# Patient Record
Sex: Male | Born: 1960 | Race: White | Hispanic: No | Marital: Married | Smoking: Former smoker
Health system: Southern US, Community
[De-identification: ages and names within clinical notes are randomized; demographics above are authoritative.]

## PROBLEM LIST (undated history)

## (undated) HISTORY — PX: APPENDECTOMY: SHX54

---

## 1999-06-14 ENCOUNTER — Other Ambulatory Visit: Admission: RE | Admit: 1999-06-14 | Discharge: 1999-06-14 | Payer: Self-pay | Admitting: Otolaryngology

## 2001-07-30 ENCOUNTER — Encounter: Payer: Self-pay | Admitting: Gastroenterology

## 2001-07-30 ENCOUNTER — Encounter: Admission: RE | Admit: 2001-07-30 | Discharge: 2001-07-30 | Payer: Self-pay | Admitting: Gastroenterology

## 2001-09-13 ENCOUNTER — Ambulatory Visit (HOSPITAL_COMMUNITY): Admission: RE | Admit: 2001-09-13 | Discharge: 2001-09-13 | Payer: Self-pay | Admitting: Gastroenterology

## 2001-09-13 ENCOUNTER — Encounter (INDEPENDENT_AMBULATORY_CARE_PROVIDER_SITE_OTHER): Payer: Self-pay | Admitting: Specialist

## 2008-11-25 ENCOUNTER — Encounter: Admission: RE | Admit: 2008-11-25 | Discharge: 2008-11-25 | Payer: Self-pay | Admitting: Gastroenterology

## 2010-03-13 IMAGING — CT CT ABDOMEN W/ CM
2 of 5 series · 17 of 46 positions shown, 19 images · IV contrast (CONTRAST)
Comparison: None

CT ABDOMEN

CLINICAL DATA: left lower quadrant pain

CT ABDOMEN AND PELVIS WITH CONTRAST
TECHNIQUE: Multidetector CT imaging of the abdomen and pelvis was
performed using the standard protocol following bolus
administration of intravenous contrast.
Contrast: 125 ml of Optiray 350

[Series 2: portal · axial · portal-venous · 0.91mm/px · z∈[+1164,+1604]mm · 14 of 102 slices shown, 16 images]
[im 7/102  soft-tissue]
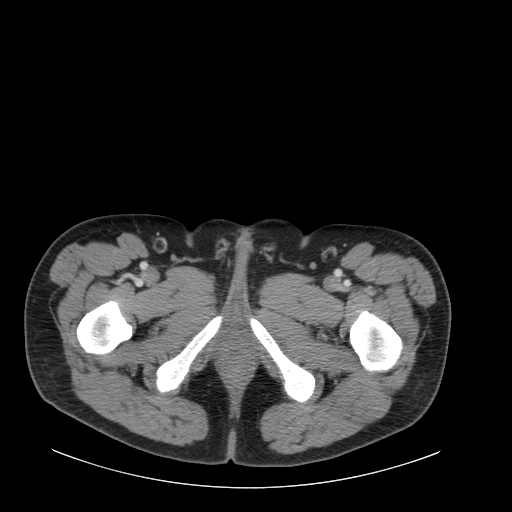
[im 7/102  bone]
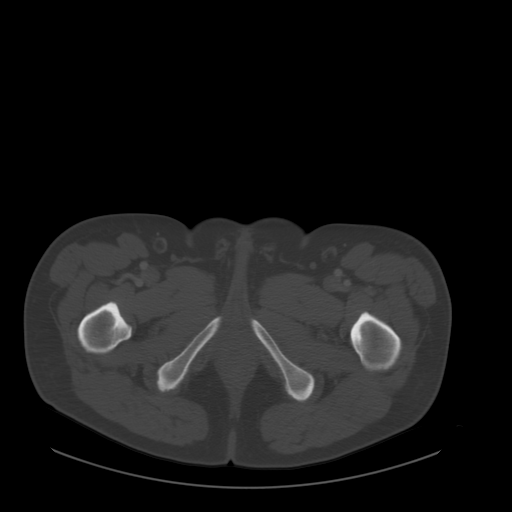
[im 13/102  soft-tissue]
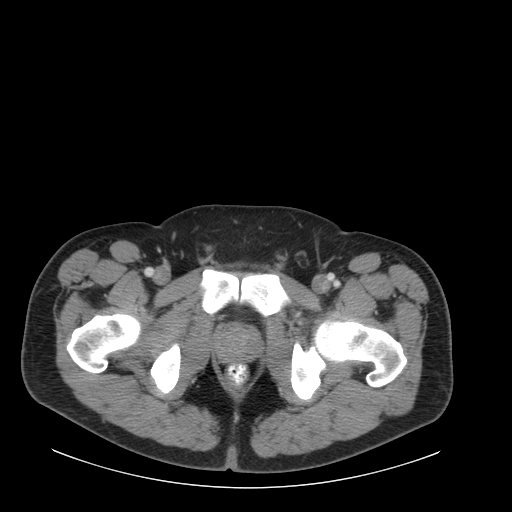
[im 19/102  soft-tissue]
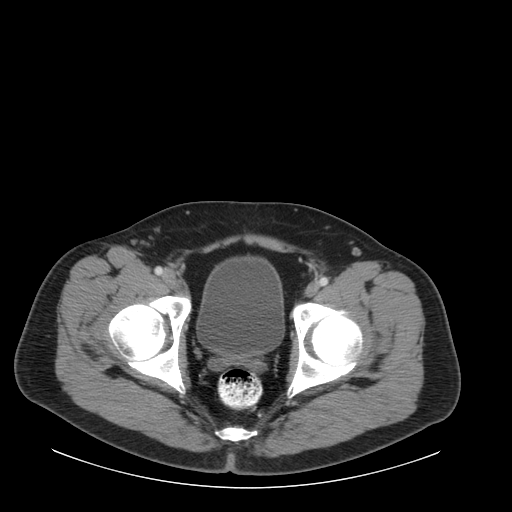
[im 26/102  soft-tissue]
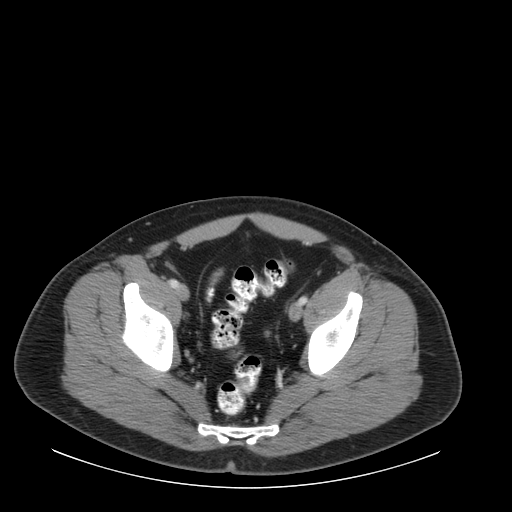
[im 32/102  soft-tissue]
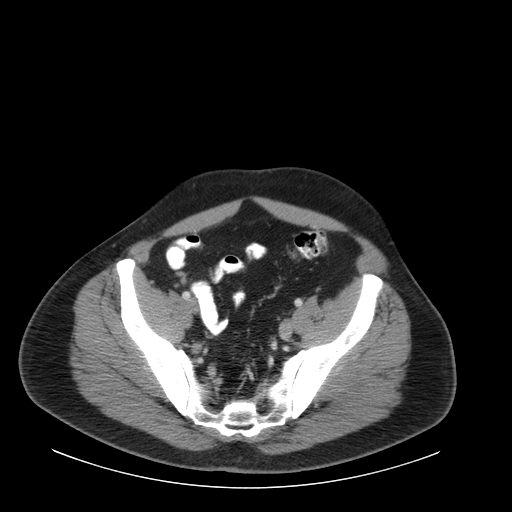
[im 38/102  soft-tissue]
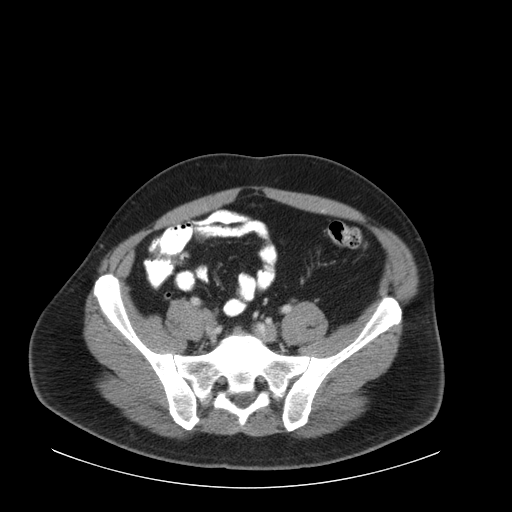
[im 45/102  soft-tissue]
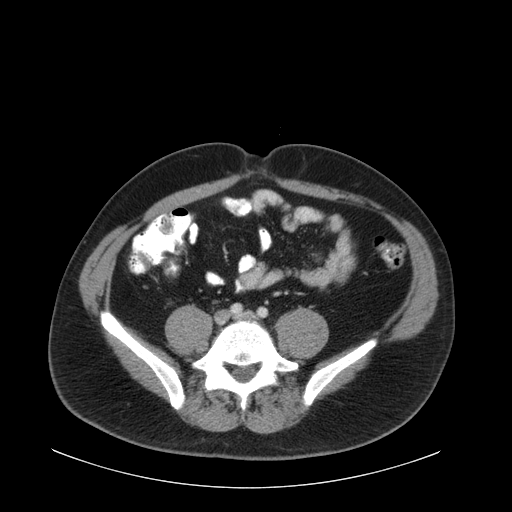
[im 57/102  soft-tissue]
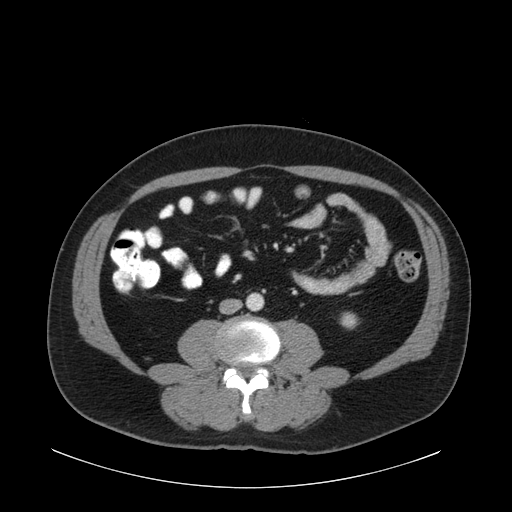
[im 64/102  soft-tissue]
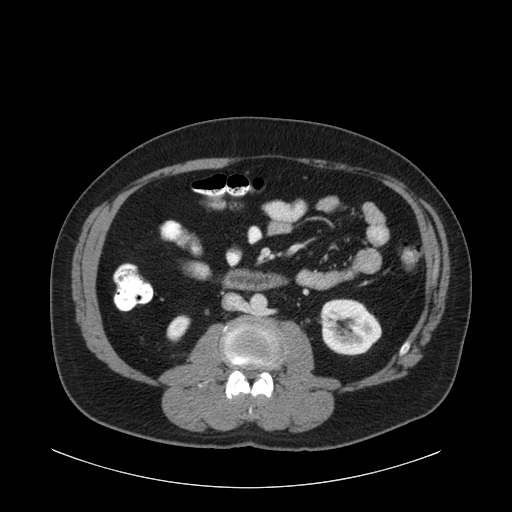
[im 64/102  bone]
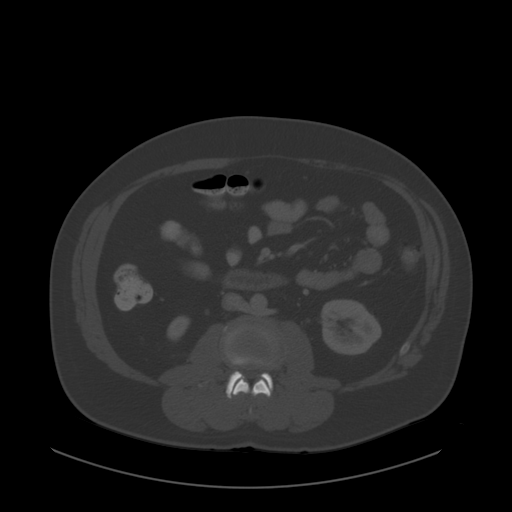
[im 70/102  soft-tissue]
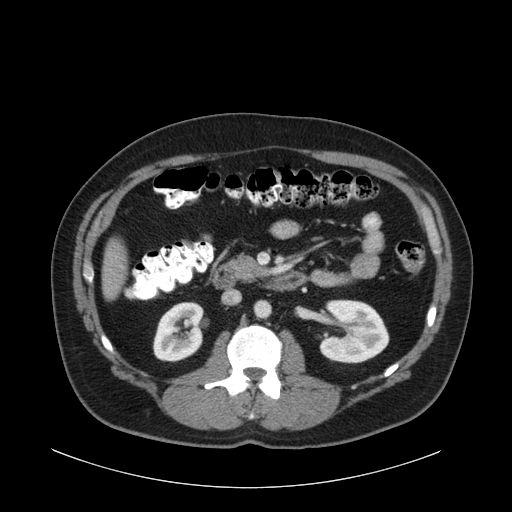
[im 76/102  soft-tissue]
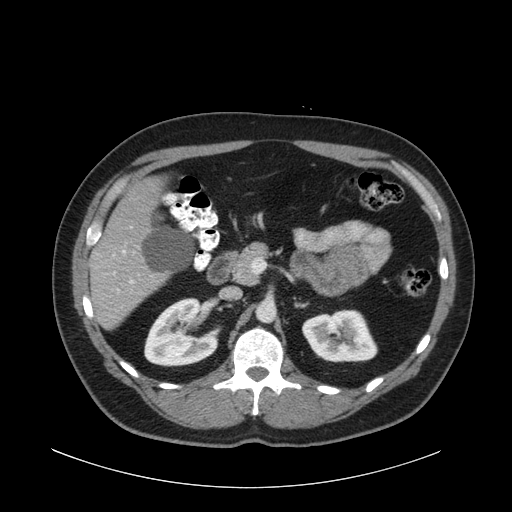
[im 83/102  soft-tissue]
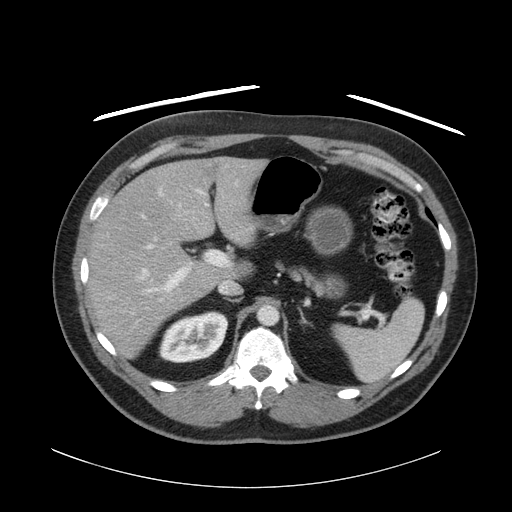
[im 89/102  soft-tissue]
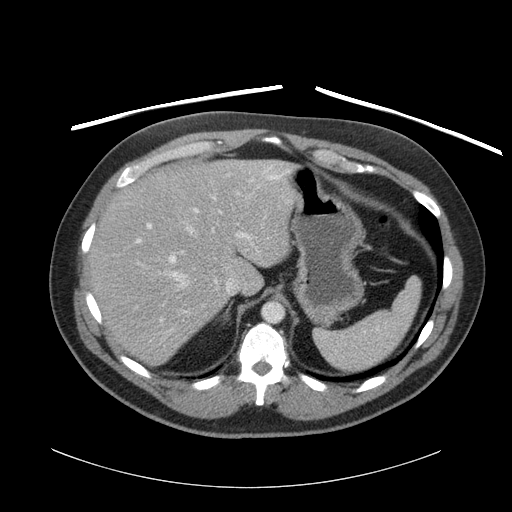
[im 95/102  soft-tissue]
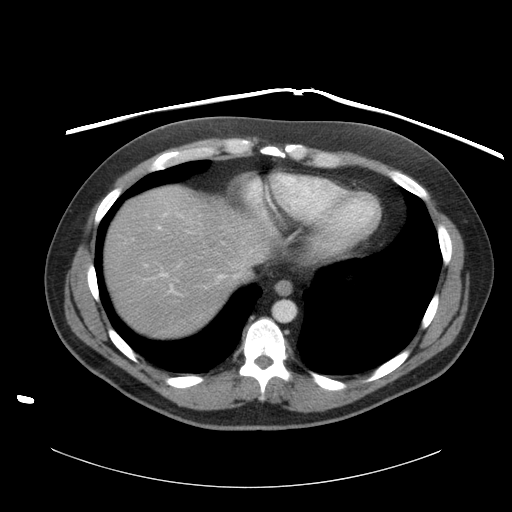

[coronals · coronal · 0.98mm/px · 3 of 125 slices shown]
[im 42/125  soft-tissue]
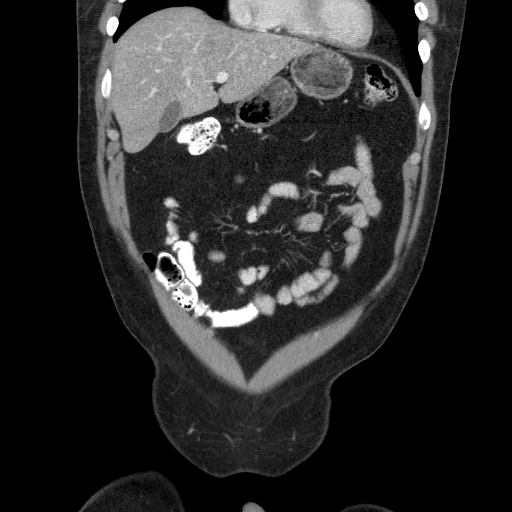
[im 56/125  soft-tissue]
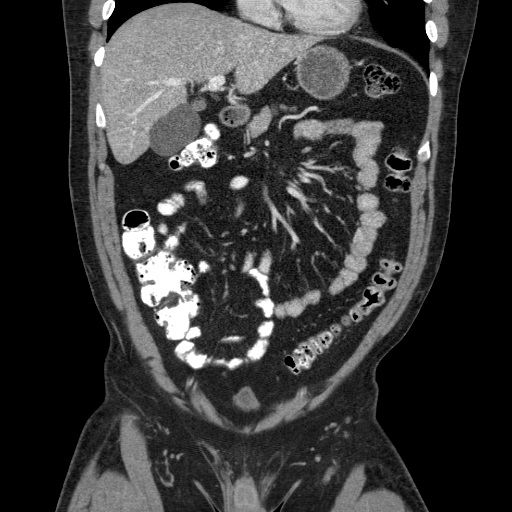
[im 69/125  soft-tissue]
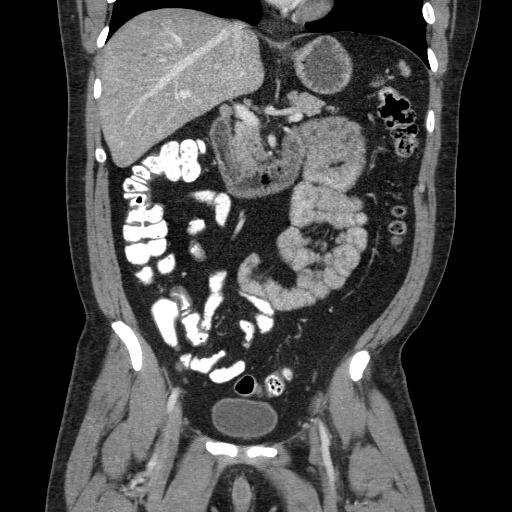

[17 of 46 positions shown; findings below may reference images not displayed]

FINDINGS: There is a calcified granuloma within the right lung
base.

There is mild fatty infiltration of the liver.

There is an exophytic low density structure arising from the
inferior right hepatic lobe measuring 4.6 cm.  This measures water
density with Hounsfield units equal to 4.2.

No internal areas of septation are associated with this structure.

Spleen is normal.

The pancreas is normal.

The adrenal glands are normal.

 The kidneys are normal.

Gallbladder is unremarkable by CT.

No biliary ductal dilation.

Stomach and visualized large and small bowel are unremarkable.

Abdominal aorta normal is in caliber.

No significant lymphadenopathy.

No free fluid or abnormal fluid collections.
IMPRESSION: 1.  No acute upper abdominal CT findings.
2.  Right hepatic lobe cyst
3.  Fatty infiltration of the liver.

CT PELVIS
FINDINGS: Appendix identified and normal.

Visualized colon and small bowel are unremarkable.

No free fluid or abnormal fluid collections.

No significant lymphadenopathy.

Urinary bladder is normal.
IMPRESSION: 1.  No acute pelvic CT findings.

## 2011-02-17 NOTE — Procedures (Signed)
[facility]  Patient:    Frederick Bell, BOESEN Visit Number: [id number] MRN: [id number]          Service Type: END Location: ENDO Attending Physician:  Charna Elizabeth Dictated by:   Anselmo Rod, M.D. Proc. Date: 09/13/01 Admit Date:  09/13/2001   CC:         Reuben Likes, M.D.   Procedure Report  DATE OF BIRTH:  August 23, 1961.  PROCEDURE:  Colonoscopy with snare polypectomy x 1.  ENDOSCOPIST:  Anselmo Rod, M.D.  INSTRUMENT USED:  Olympus video colonoscope.  INDICATION FOR PROCEDURE:  Blood in stool and left lower quadrant pain in a 50 year old white male.  Rule out colonic polyps, masses, hemorrhoids, etc.  PREPROCEDURE PREPARATION:  Informed consent was procured from the patient. The patient was fasted for eight hours prior to the procedure and prepped with a bottle of magnesium citrate and a gallon of NuLytely the night prior to the procedure.  PREPROCEDURE PHYSICAL:  VITAL SIGNS:  The patient had stable vital signs.  NECK:  Supple.  CHEST:  Clear to auscultation.  S1, S2 regular.  ABDOMEN:  Soft with normal bowel sounds.  DESCRIPTION OF PROCEDURE:  The patient was placed in the left lateral decubitus position and sedated with 35 mg of Demerol and 5 mg of Versed intravenously.  Once the patient was adequately sedate and maintained on low-flow oxygen and continuous cardiac monitoring, the Olympus video colonoscope was advanced from the rectum to the cecum without difficulty.  The patient had small amount of residual stool in the colon.  Multiple washes were done.  A small sessile polyp was snared from the midtransverse colon.  The procedure was complete up to the cecum.  The appendiceal orifice and ileocecal valve were clearly visualized and photographed.  IMPRESSION:  Small sessile polyp snared from midtransverse colon, otherwise normal colonoscopy.  RECOMMENDATIONS: 1. Await pathology results. 2. Outpatient follow-up  in the next three to four weeks. Dictated by:   Anselmo Rod, M.D. Attending Physician:  Charna Elizabeth DD:  09/13/01 TD:  09/13/01 Job: [id number]

## 2015-03-16 ENCOUNTER — Ambulatory Visit (INDEPENDENT_AMBULATORY_CARE_PROVIDER_SITE_OTHER): Payer: 59 | Admitting: Sports Medicine

## 2015-03-16 VITALS — BP 120/78 | HR 86 | Temp 98.8°F | Resp 20 | Ht 72.0 in | Wt 238.0 lb

## 2015-03-16 DIAGNOSIS — Z7189 Other specified counseling: Secondary | ICD-10-CM | POA: Diagnosis not present

## 2015-03-16 DIAGNOSIS — IMO0002 Reserved for concepts with insufficient information to code with codable children: Secondary | ICD-10-CM

## 2015-03-16 NOTE — Progress Notes (Signed)

## 2015-03-16 NOTE — Progress Notes (Signed)
   Subjective:    Patient ID: Frederick Bell, male    DOB: 01-21-1961, 54 y.o.   MRN: [id number]  HPI Presenting for pretravel health evaluation. Report currently up-to-date on all immunizations recently seen in health department for hepatitis A and B per CDC recommendations. No prior TB exposure. TB questionnaire performed by myself per her documentation with no evidence of latent or active TB. Report prior PPD reported as negative.  Review of Systems per HPI     Objective:   Physical Exam PHYSICAL EXAM: GENERAL: Adult male. No acute distress PSYCH: Alert and appropriately interactive. SKIN: No open skin lesions or abnormal skin markings on areas inspected as below VASCULAR: DP & PT pulses 2+/4. No pre-tibial edema HEART: S1-S2 heard, regular rate and rhythm, no murmur. LUNGS: Clear to auscultation bilaterally throughout all lung fields     Assessment & Plan:  Pretravel health screening forms for traveling to [place] completed today. Is been provided on infectious disease good practices including hand hygiene and respiratory practices. Follow-up as needed

## 2015-11-30 ENCOUNTER — Ambulatory Visit (INDEPENDENT_AMBULATORY_CARE_PROVIDER_SITE_OTHER): Payer: 59 | Admitting: Urgent Care

## 2015-11-30 VITALS — BP 140/80 | HR 89 | Temp 98.9°F | Resp 16 | Ht 72.0 in | Wt 240.6 lb

## 2015-11-30 DIAGNOSIS — R0982 Postnasal drip: Secondary | ICD-10-CM

## 2015-11-30 DIAGNOSIS — R05 Cough: Secondary | ICD-10-CM | POA: Diagnosis not present

## 2015-11-30 DIAGNOSIS — Z23 Encounter for immunization: Secondary | ICD-10-CM | POA: Diagnosis not present

## 2015-11-30 DIAGNOSIS — Z119 Encounter for screening for infectious and parasitic diseases, unspecified: Secondary | ICD-10-CM

## 2015-11-30 DIAGNOSIS — R059 Cough, unspecified: Secondary | ICD-10-CM

## 2015-11-30 DIAGNOSIS — J329 Chronic sinusitis, unspecified: Secondary | ICD-10-CM

## 2015-11-30 DIAGNOSIS — J302 Other seasonal allergic rhinitis: Secondary | ICD-10-CM

## 2015-11-30 DIAGNOSIS — R030 Elevated blood-pressure reading, without diagnosis of hypertension: Secondary | ICD-10-CM | POA: Diagnosis not present

## 2015-11-30 MED ORDER — PSEUDOEPHEDRINE HCL ER 120 MG PO TB12: 120.0000 mg | ORAL_TABLET | Freq: Two times a day (BID) | ORAL | Status: AC

## 2015-11-30 MED ORDER — CETIRIZINE HCL 10 MG PO TABS: 10.0000 mg | ORAL_TABLET | Freq: Every day | ORAL | Status: AC

## 2015-11-30 NOTE — Progress Notes (Signed)
    MRN: [id number] DOB: 05/23/1961  Subjective:   Frederick Bell is a 55 y.o. 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 Admits 1 week history of cough. Has tried Mucinex with some relief. Admits history of seasonal allergies but does not take any medications for this. Denies fever, chest pain, shob, n/v, abdominal pain, weight loss. Denies smoking cigarettes, has occasional drink of alcohol. Denies history of HIV, syphilis, tuberculosis. Denies history of mental illness, thoughts about hurting himself or others.   Frederick Bell has a current medication list which includes the following prescription(s): guaifenesin. Also has no allergies on file.  Frederick Bell  has no past medical history on file. Also  has past surgical history that includes Appendectomy.  Objective:   Vitals: BP 140/80 mmHg  Pulse 89  Temp(Src) 98.9 F (37.2 C) (Oral)  Resp 16  Ht 6' (1.829 m)  Wt 240 lb 9.6 oz (109.135 kg)  BMI 32.62 kg/m2  SpO2 98%  Wt Readings from Last 3 Encounters:  11/30/15 240 lb 9.6 oz (109.135 kg)  03/16/15 238 lb (107.956 kg)   Physical Exam  Constitutional: He is oriented to person, place, and time. He appears well-developed and well-nourished.  HENT:  TM's intact bilaterally, no effusions or erythema. Nasal turbinates boggy and edematous. No sinus tenderness. Postnasal drip present, without oropharyngeal exudates, erythema or abscesses.  Eyes: Right eye exhibits no discharge. Left eye exhibits no discharge. No scleral icterus.  Neck: Normal range of motion. Neck supple.  Cardiovascular: Normal rate, regular rhythm and intact distal pulses.  Exam reveals no gallop and no friction rub.   No murmur heard. Pulmonary/Chest: No respiratory distress. He has no wheezes. He has no rales.  Abdominal: Soft. Bowel sounds are normal. He exhibits no distension and no mass. There is no tenderness.    Musculoskeletal: He exhibits no edema.  Lymphadenopathy:    He has no cervical adenopathy.  Neurological: He is alert and oriented to person, place, and time.  Skin: Skin is warm and dry.   Assessment and Plan :   1. Screening examination for infectious disease - Labs pending, will complete form thereafter.  2. Need for prophylactic vaccination and inoculation against influenza - I suggested patient hold off on this until his current symptoms resolve.   3. Cough 4. Post-nasal drainage 5. Seasonal allergies - Advised better control of allergies with Zyrtec and Sudafed as needed. RTC in 1 week if no improvement.   6. Elevated blood pressure reading without diagnosis of hypertension - Monitor, counseled patient on risks of uncontrolled hypertension. He agreed to keep a check on his BP and rtc if it remains >140/90.  Wallis Bamberg, PA-C [facility] [phone] 11/30/2015 2:03 PM

## 2015-11-30 NOTE — Patient Instructions (Signed)

## 2015-12-01 LAB — HIV ANTIBODY (ROUTINE TESTING W REFLEX): HIV 1&2 Ab, 4th Generation: NONREACTIVE

## 2015-12-01 LAB — RPR

## 2015-12-02 LAB — QUANTIFERON TB GOLD ASSAY (BLOOD)
Interferon Gamma Release Assay: NEGATIVE
Mitogen-Nil: >10 IU/mL
Quantiferon Nil Value: 0.02 IU/mL
Quantiferon Tb Ag Minus Nil Value: 0 IU/mL

## 2015-12-03 ENCOUNTER — Telehealth: Payer: Self-pay | Admitting: Urgent Care

## 2015-12-03 NOTE — Telephone Encounter (Signed)
Pt has called about his lab results.  He stated that he is going in for a visa on Monday and will need the results of these tests by Monday.  Kathlene NovemberMike please advise. Thanks

## 2015-12-03 NOTE — Telephone Encounter (Signed)
Paper work was complete and patient notified.

## 2018-07-24 ENCOUNTER — Other Ambulatory Visit: Payer: Self-pay | Admitting: Gastroenterology

## 2018-07-24 DIAGNOSIS — R7989 Other specified abnormal findings of blood chemistry: Secondary | ICD-10-CM

## 2018-07-24 DIAGNOSIS — R945 Abnormal results of liver function studies: Principal | ICD-10-CM

## 2018-07-29 ENCOUNTER — Ambulatory Visit (HOSPITAL_COMMUNITY)
Admission: RE | Admit: 2018-07-29 | Discharge: 2018-07-29 | Disposition: A | Discharge status: Home / Self Care | Payer: Managed Care, Other (non HMO) | Source: Ambulatory Visit | Attending: Gastroenterology | Admitting: Gastroenterology

## 2018-07-29 DIAGNOSIS — K7689 Other specified diseases of liver: Secondary | ICD-10-CM | POA: Insufficient documentation

## 2018-07-29 DIAGNOSIS — R7989 Other specified abnormal findings of blood chemistry: Secondary | ICD-10-CM | POA: Diagnosis not present

## 2018-07-31 ENCOUNTER — Other Ambulatory Visit: Payer: Self-pay | Admitting: Gastroenterology

## 2018-07-31 DIAGNOSIS — R933 Abnormal findings on diagnostic imaging of other parts of digestive tract: Secondary | ICD-10-CM

## 2018-08-02 ENCOUNTER — Telehealth: Payer: Self-pay | Admitting: Gastroenterology

## 2018-08-05 ENCOUNTER — Ambulatory Visit (HOSPITAL_COMMUNITY): Admission: RE | Admit: 2018-08-05 | Payer: Managed Care, Other (non HMO) | Source: Ambulatory Visit

## 2018-08-05 ENCOUNTER — Other Ambulatory Visit: Payer: Self-pay | Admitting: Gastroenterology

## 2018-08-05 DIAGNOSIS — R933 Abnormal findings on diagnostic imaging of other parts of digestive tract: Secondary | ICD-10-CM

## 2018-08-07 ENCOUNTER — Ambulatory Visit
Admission: RE | Admit: 2018-08-07 | Discharge: 2018-08-07 | Disposition: A | Discharge status: Home / Self Care | Payer: Managed Care, Other (non HMO) | Source: Ambulatory Visit | Attending: Gastroenterology | Admitting: Gastroenterology

## 2018-08-07 DIAGNOSIS — R933 Abnormal findings on diagnostic imaging of other parts of digestive tract: Secondary | ICD-10-CM

## 2018-08-07 MED ORDER — GADOBENATE DIMEGLUMINE 529 MG/ML IV SOLN
20.0000 mL | Freq: Once | INTRAVENOUS | Status: AC | PRN
  Administered 2018-08-07: 20 mL via INTRAVENOUS

## 2018-08-13 ENCOUNTER — Other Ambulatory Visit: Payer: Managed Care, Other (non HMO)

## 2018-12-10 ENCOUNTER — Other Ambulatory Visit (HOSPITAL_BASED_OUTPATIENT_CLINIC_OR_DEPARTMENT_OTHER): Payer: Self-pay

## 2018-12-10 DIAGNOSIS — R0683 Snoring: Secondary | ICD-10-CM

## 2018-12-10 DIAGNOSIS — G4710 Hypersomnia, unspecified: Secondary | ICD-10-CM

## 2018-12-10 DIAGNOSIS — R5383 Other fatigue: Secondary | ICD-10-CM

## 2018-12-10 DIAGNOSIS — G4730 Sleep apnea, unspecified: Secondary | ICD-10-CM

## 2018-12-17 ENCOUNTER — Encounter (HOSPITAL_BASED_OUTPATIENT_CLINIC_OR_DEPARTMENT_OTHER): Payer: Managed Care, Other (non HMO)

## 2019-01-15 ENCOUNTER — Encounter (HOSPITAL_BASED_OUTPATIENT_CLINIC_OR_DEPARTMENT_OTHER): Payer: Managed Care, Other (non HMO)

## 2019-02-11 ENCOUNTER — Encounter (HOSPITAL_BASED_OUTPATIENT_CLINIC_OR_DEPARTMENT_OTHER): Payer: Managed Care, Other (non HMO)

## 2019-02-21 ENCOUNTER — Ambulatory Visit (HOSPITAL_BASED_OUTPATIENT_CLINIC_OR_DEPARTMENT_OTHER): Payer: Managed Care, Other (non HMO) | Attending: Internal Medicine | Admitting: Internal Medicine

## 2019-02-21 ENCOUNTER — Other Ambulatory Visit: Payer: Self-pay

## 2019-02-21 DIAGNOSIS — R0683 Snoring: Secondary | ICD-10-CM | POA: Diagnosis not present

## 2019-02-21 DIAGNOSIS — G4710 Hypersomnia, unspecified: Secondary | ICD-10-CM

## 2019-02-21 DIAGNOSIS — G4730 Sleep apnea, unspecified: Secondary | ICD-10-CM

## 2019-02-21 DIAGNOSIS — G4733 Obstructive sleep apnea (adult) (pediatric): Secondary | ICD-10-CM | POA: Insufficient documentation

## 2019-02-21 DIAGNOSIS — R5383 Other fatigue: Secondary | ICD-10-CM

## 2019-03-02 NOTE — Procedures (Signed)
    Patient Name: Frederick Bell, Frederick Bell Date: 02/23/2019 Gender: Male D.O.B: 24-Jun-1961 Age (years): 89 Referring Provider: Jamison Oka MD Height (inches): 72 Interpreting Physician: Jetty Duhamel MD, ABSM Weight (lbs): 229 RPSGT: [place] BMI: 31 MRN: [id number] Neck Size: 16.50  CLINICAL INFORMATION Sleep Study Type: HST Indication for sleep study: Excessive Daytime Sleepiness, Fatigue, Snoring, Witnessed Apneas Epworth Sleepiness Score: 8  SLEEP STUDY TECHNIQUE A multi-channel overnight portable sleep study was performed. The channels recorded were: nasal airflow, thoracic respiratory movement, and oxygen saturation with a pulse oximetry. Snoring was also monitored.  MEDICATIONS Patient self administered medications include: none reported.  SLEEP ARCHITECTURE Patient was studied for 454 minutes. The sleep efficiency was 97.6 % and the patient was supine for 0%. The arousal index was 0.0 per hour.  RESPIRATORY PARAMETERS The overall AHI was 7.4 per hour, with a central apnea index of 0.0 per hour. The oxygen nadir was 87% during sleep.  CARDIAC DATA Mean heart rate during sleep was 82.1 bpm.  IMPRESSIONS - Mild obstructive sleep apnea occurred during this study (AHI = 7.4/h). - No significant central sleep apnea occurred during this study (CAI = 0.0/h). - Mild oxygen desaturation was noted during this study (Min O2 = 87%). Mean sat 95%. - Patient snored.  DIAGNOSIS - Obstructive Sleep Apnea (327.23 [G47.33 ICD-10])  RECOMMENDATIONS - Treatment for very mild OSA is directed at symptoms. Conservative management might include observation, weight loss and sleep position off back. Other options, including CPAP, Sleep medical consultation, a fitted oral appliance or ENT evaluation, would be based on clinical judgment. - Be careful with alcohol, sedatives and other CNS depressants that may worsen sleep apnea and disrupt normal sleep architecture. - Sleep hygiene  should be reviewed to assess factors that may improve sleep quality. - Weight management and regular exercise should be initiated or continued.  [Electronically signed] 03/02/2019 12:24 PM  Jetty Duhamel MD, ABSM Diplomate, American Board of Sleep Medicine   NPI: [id number]                         Jetty Duhamel Diplomate, American Board of Sleep Medicine  ELECTRONICALLY SIGNED ON:  03/02/2019, 12:25 PM [facility] PH: [phone]   FX: [phone] ACCREDITED BY THE AMERICAN ACADEMY OF SLEEP MEDICINE

## 2019-03-18 ENCOUNTER — Other Ambulatory Visit: Payer: Self-pay | Admitting: Internal Medicine

## 2019-03-18 ENCOUNTER — Ambulatory Visit
Admission: RE | Admit: 2019-03-18 | Discharge: 2019-03-18 | Disposition: A | Discharge status: Home / Self Care | Payer: Managed Care, Other (non HMO) | Source: Ambulatory Visit | Attending: Internal Medicine | Admitting: Internal Medicine

## 2019-03-18 DIAGNOSIS — B9689 Other specified bacterial agents as the cause of diseases classified elsewhere: Secondary | ICD-10-CM

## 2019-03-18 DIAGNOSIS — J019 Acute sinusitis, unspecified: Secondary | ICD-10-CM

## 2019-05-22 ENCOUNTER — Ambulatory Visit (INDEPENDENT_AMBULATORY_CARE_PROVIDER_SITE_OTHER): Payer: Managed Care, Other (non HMO) | Admitting: Otolaryngology

## 2019-05-22 DIAGNOSIS — H9012 Conductive hearing loss, unilateral, left ear, with unrestricted hearing on the contralateral side: Secondary | ICD-10-CM

## 2019-05-22 DIAGNOSIS — J320 Chronic maxillary sinusitis: Secondary | ICD-10-CM

## 2019-05-22 DIAGNOSIS — J343 Hypertrophy of nasal turbinates: Secondary | ICD-10-CM

## 2019-05-22 DIAGNOSIS — J310 Chronic rhinitis: Secondary | ICD-10-CM | POA: Diagnosis not present

## 2019-06-04 ENCOUNTER — Other Ambulatory Visit: Payer: Self-pay | Admitting: Otolaryngology

## 2019-06-04 DIAGNOSIS — J329 Chronic sinusitis, unspecified: Secondary | ICD-10-CM

## 2019-06-11 ENCOUNTER — Ambulatory Visit
Admission: RE | Admit: 2019-06-11 | Discharge: 2019-06-11 | Disposition: A | Discharge status: Home / Self Care | Payer: Managed Care, Other (non HMO) | Source: Ambulatory Visit | Attending: Otolaryngology | Admitting: Otolaryngology

## 2019-06-11 ENCOUNTER — Other Ambulatory Visit: Payer: Self-pay

## 2019-06-11 DIAGNOSIS — J329 Chronic sinusitis, unspecified: Secondary | ICD-10-CM

## 2019-06-19 ENCOUNTER — Ambulatory Visit (INDEPENDENT_AMBULATORY_CARE_PROVIDER_SITE_OTHER): Payer: Managed Care, Other (non HMO) | Admitting: Otolaryngology

## 2019-07-07 ENCOUNTER — Ambulatory Visit (INDEPENDENT_AMBULATORY_CARE_PROVIDER_SITE_OTHER): Payer: Managed Care, Other (non HMO) | Admitting: Otolaryngology

## 2019-07-07 ENCOUNTER — Other Ambulatory Visit: Payer: Self-pay

## 2019-07-07 DIAGNOSIS — J320 Chronic maxillary sinusitis: Secondary | ICD-10-CM | POA: Diagnosis not present

## 2019-07-07 DIAGNOSIS — H903 Sensorineural hearing loss, bilateral: Secondary | ICD-10-CM | POA: Diagnosis not present

## 2019-07-07 DIAGNOSIS — J310 Chronic rhinitis: Secondary | ICD-10-CM

## 2019-07-07 DIAGNOSIS — H9313 Tinnitus, bilateral: Secondary | ICD-10-CM | POA: Diagnosis not present

## 2019-07-07 DIAGNOSIS — J343 Hypertrophy of nasal turbinates: Secondary | ICD-10-CM

## 2019-07-18 ENCOUNTER — Other Ambulatory Visit: Payer: Self-pay | Admitting: Internal Medicine

## 2019-07-18 DIAGNOSIS — K7689 Other specified diseases of liver: Secondary | ICD-10-CM

## 2019-07-24 ENCOUNTER — Ambulatory Visit
Admission: RE | Admit: 2019-07-24 | Discharge: 2019-07-24 | Disposition: A | Discharge status: Home / Self Care | Payer: Managed Care, Other (non HMO) | Source: Ambulatory Visit | Attending: Internal Medicine | Admitting: Internal Medicine

## 2019-07-24 DIAGNOSIS — K7689 Other specified diseases of liver: Secondary | ICD-10-CM

## 2019-11-14 IMAGING — US US ABDOMEN LIMITED
1 series · 14 of 25 positions shown · non-contrast
Comparison: CT abdomen and pelvis 11/25/2008

CLINICAL DATA: Elevated liver function tests.

EXAM:
ULTRASOUND ABDOMEN LIMITED RIGHT UPPER QUADRANT

[Series 1: us abdomen limited · 14 of 57 slices shown]
[im 1/57]
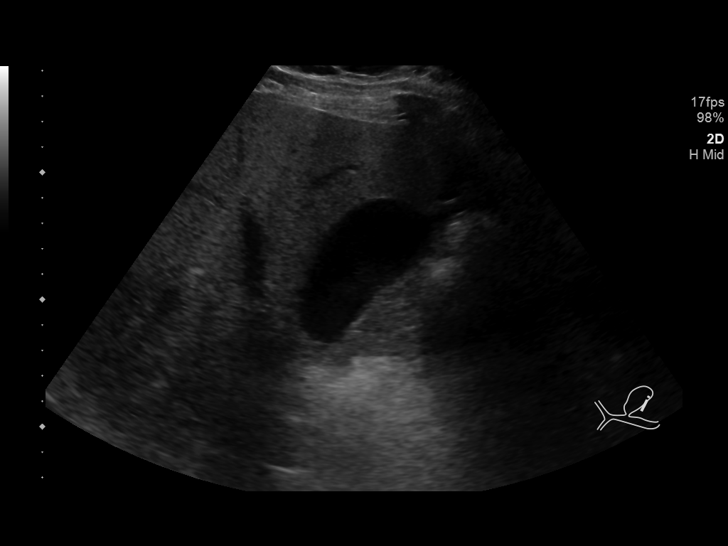
[im 5/57]
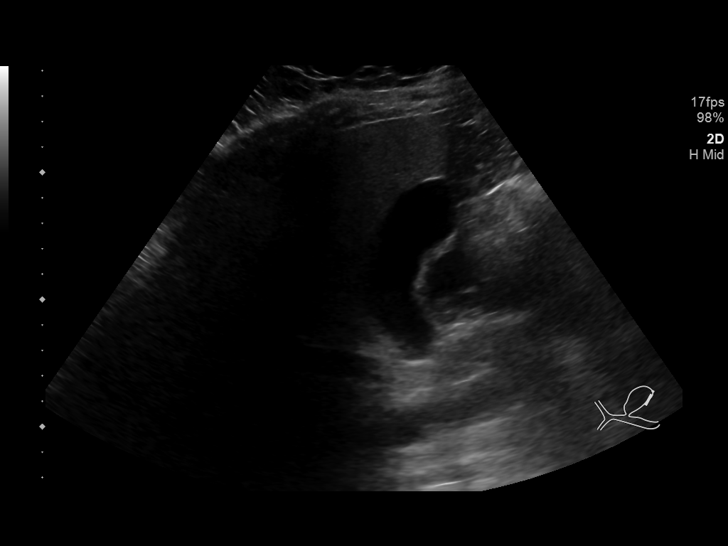
[im 10/57]
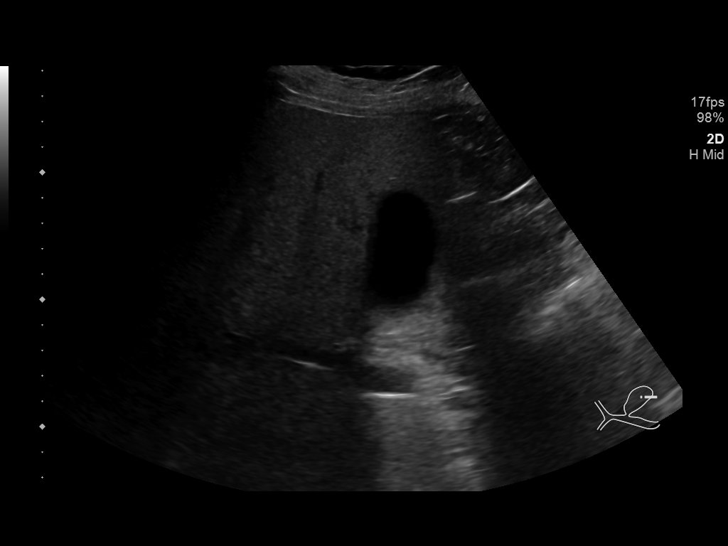
[im 15/57]
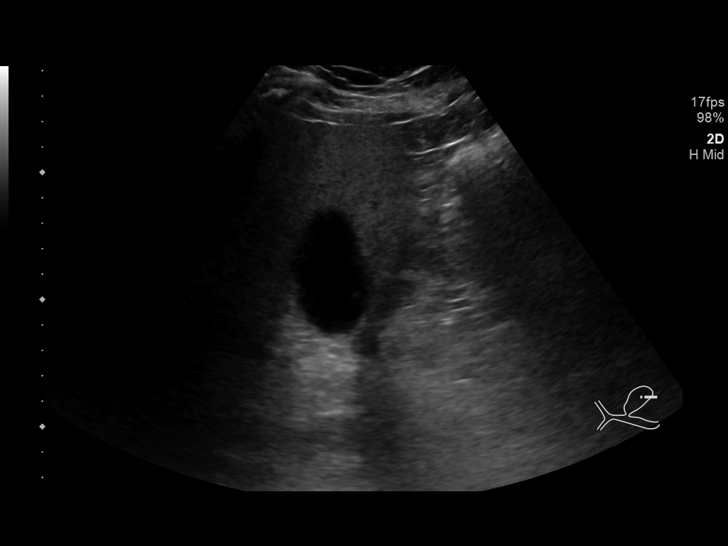
[im 19/57]
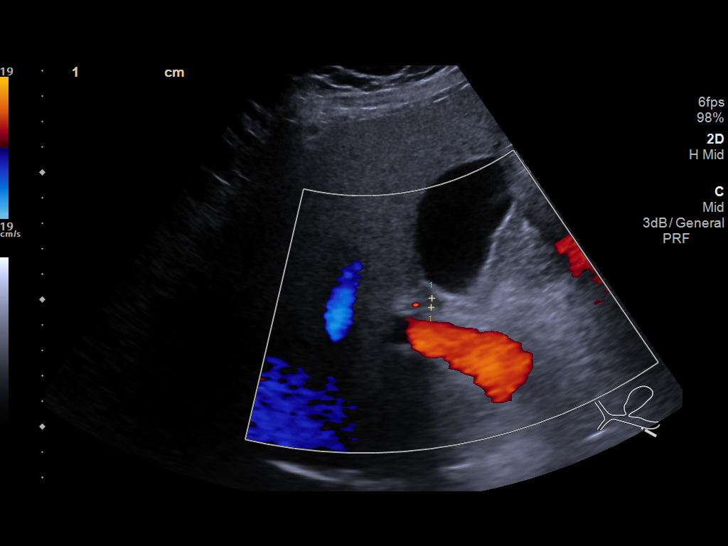
[im 22/57]
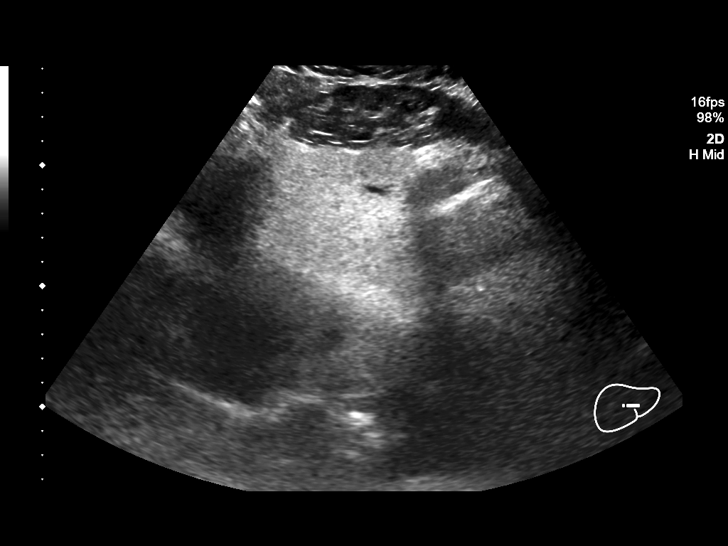
[im 26/57]
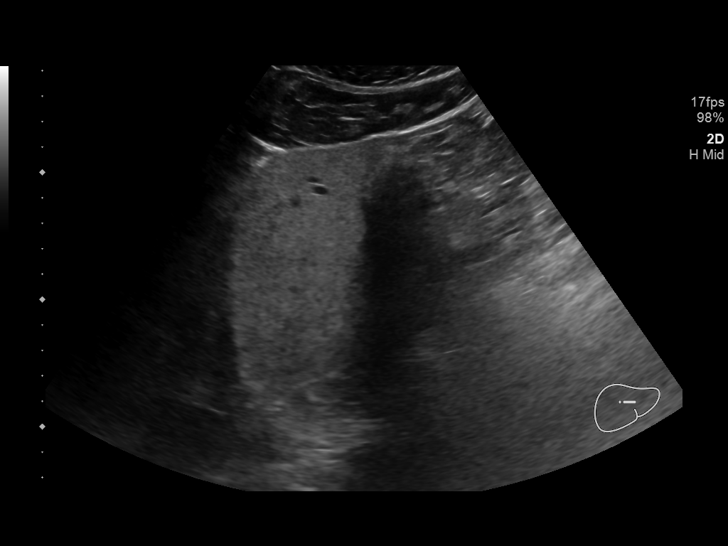
[im 31/57]
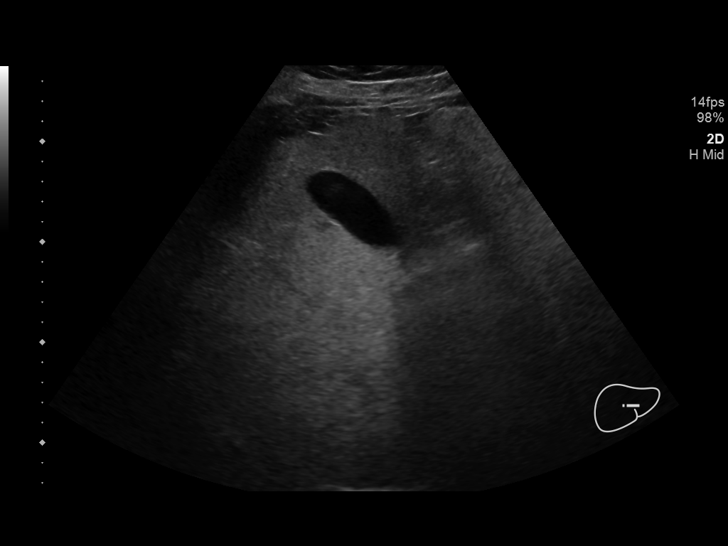
[im 36/57]
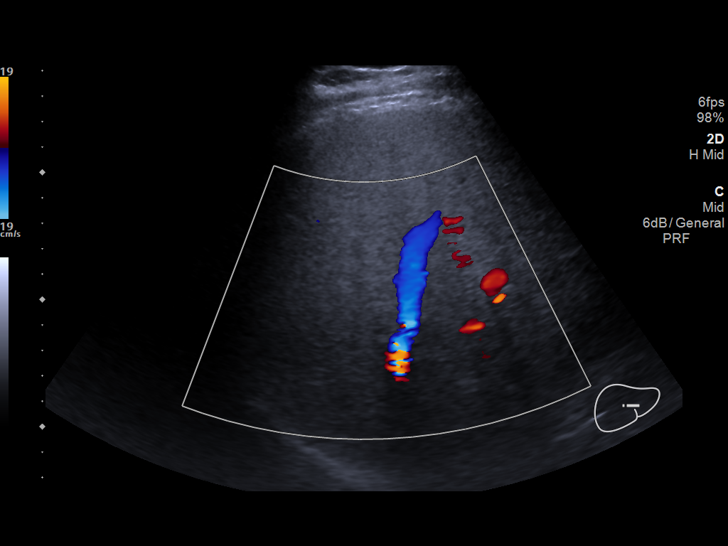
[im 38/57]
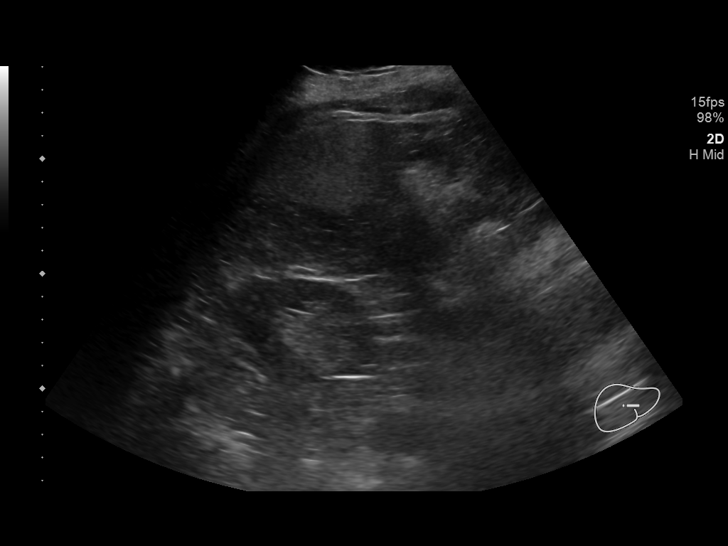
[im 43/57]
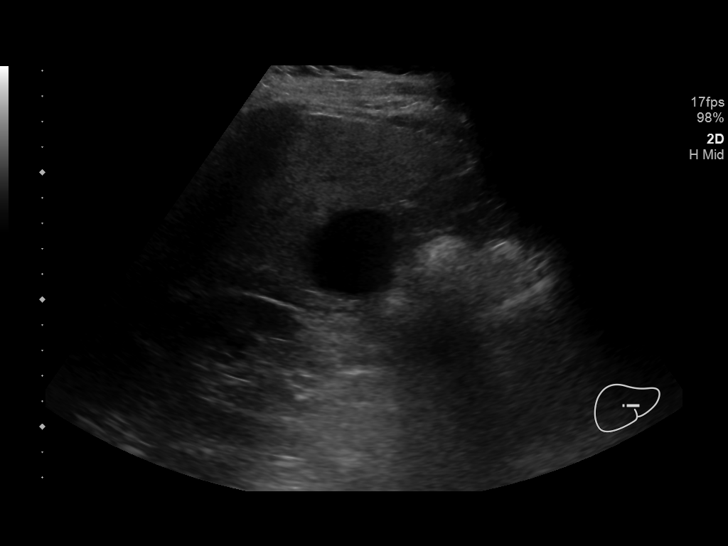
[im 47/57]
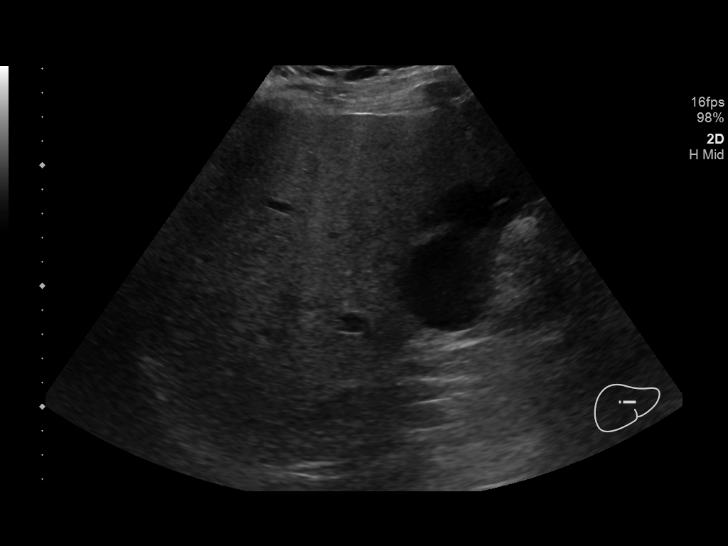
[im 52/57]
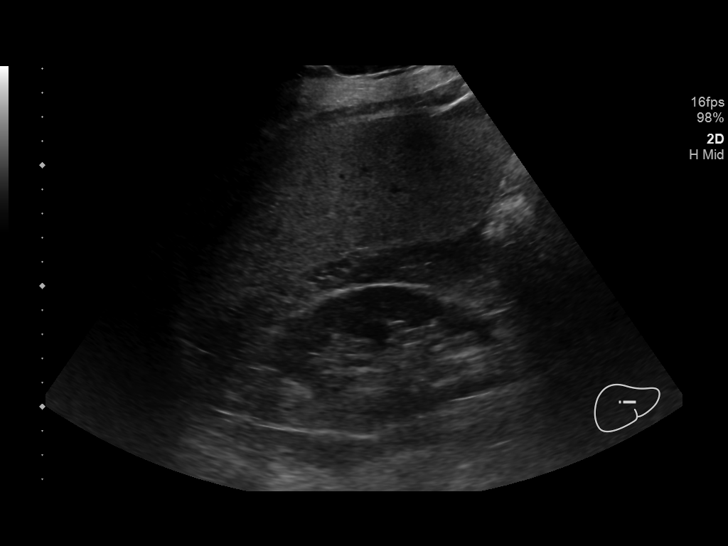
[im 57/57]
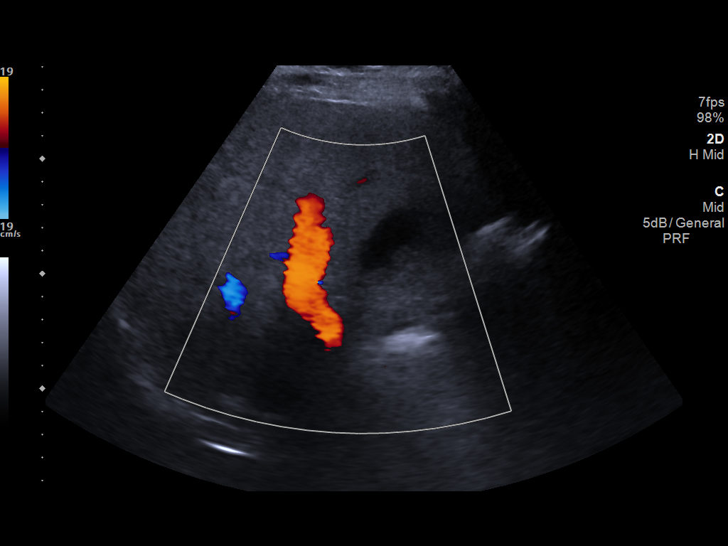

[14 of 25 positions shown; findings below may reference images not displayed]

FINDINGS: Gallbladder:

No gallstones or wall thickening visualized. No sonographic Murphy
sign noted by sonographer.

Common bile duct:

Diameter: 4 mm

Liver:

Moderately increased parenchymal echogenicity diffusely. 5 cm simple
cyst in the right hepatic lobe, also present on the prior CT and not
significantly changed in size. Slight nodularity of the liver
contour. Portal vein is patent on color Doppler imaging with normal
direction of blood flow towards the liver.
IMPRESSION: 1. Echogenic liver, nonspecific though can be seen with steatosis,
chronic hepatitis, and infiltrative processes. Slightly nodular
liver contour could indicate cirrhosis.
2. 5 cm hepatic cyst.
3. No gallstones or biliary dilatation.

## 2020-09-06 ENCOUNTER — Other Ambulatory Visit: Payer: Self-pay | Admitting: Internal Medicine

## 2020-09-06 DIAGNOSIS — K760 Fatty (change of) liver, not elsewhere classified: Secondary | ICD-10-CM

## 2021-01-04 ENCOUNTER — Other Ambulatory Visit: Payer: Self-pay | Admitting: Internal Medicine

## 2021-01-04 DIAGNOSIS — K760 Fatty (change of) liver, not elsewhere classified: Secondary | ICD-10-CM

## 2021-01-04 DIAGNOSIS — M5416 Radiculopathy, lumbar region: Secondary | ICD-10-CM

## 2021-01-04 DIAGNOSIS — G959 Disease of spinal cord, unspecified: Secondary | ICD-10-CM

## 2021-01-27 ENCOUNTER — Other Ambulatory Visit: Payer: Self-pay

## 2021-01-27 ENCOUNTER — Other Ambulatory Visit: Payer: Managed Care, Other (non HMO)

## 2021-01-27 ENCOUNTER — Ambulatory Visit
Admission: RE | Admit: 2021-01-27 | Discharge: 2021-01-27 | Disposition: A | Discharge status: Home / Self Care | Payer: 59 | Source: Ambulatory Visit | Attending: Internal Medicine | Admitting: Internal Medicine

## 2021-01-27 DIAGNOSIS — G959 Disease of spinal cord, unspecified: Secondary | ICD-10-CM

## 2021-01-31 ENCOUNTER — Ambulatory Visit
Admission: RE | Admit: 2021-01-31 | Discharge: 2021-01-31 | Disposition: A | Discharge status: Home / Self Care | Payer: Managed Care, Other (non HMO) | Source: Ambulatory Visit | Attending: Internal Medicine | Admitting: Internal Medicine

## 2021-01-31 DIAGNOSIS — K760 Fatty (change of) liver, not elsewhere classified: Secondary | ICD-10-CM

## 2021-02-02 ENCOUNTER — Other Ambulatory Visit: Payer: 59

## 2021-05-16 ENCOUNTER — Other Ambulatory Visit: Payer: Self-pay | Admitting: Internal Medicine

## 2021-05-16 DIAGNOSIS — K760 Fatty (change of) liver, not elsewhere classified: Secondary | ICD-10-CM
